# Patient Record
Sex: Male | Born: 1965 | Race: White | Hispanic: No | Marital: Married | Smoking: Never smoker
Health system: Southern US, Community
[De-identification: ages and names within clinical notes are randomized; demographics above are authoritative.]

## PROBLEM LIST (undated history)

## (undated) DIAGNOSIS — K219 Gastro-esophageal reflux disease without esophagitis: Secondary | ICD-10-CM

## (undated) HISTORY — PX: CHOLECYSTECTOMY: SHX55

## (undated) HISTORY — PX: SHOULDER SURGERY: SHX246

## (undated) HISTORY — PX: KNEE SURGERY: SHX244

## (undated) HISTORY — DX: Gastro-esophageal reflux disease without esophagitis: K21.9

---

## 2013-07-25 ENCOUNTER — Encounter: Payer: Self-pay | Admitting: *Deleted

## 2013-07-28 ENCOUNTER — Ambulatory Visit (INDEPENDENT_AMBULATORY_CARE_PROVIDER_SITE_OTHER): Payer: BC Managed Care – PPO | Admitting: Neurology

## 2013-07-28 ENCOUNTER — Encounter: Payer: Self-pay | Admitting: Neurology

## 2013-07-28 ENCOUNTER — Encounter (INDEPENDENT_AMBULATORY_CARE_PROVIDER_SITE_OTHER): Payer: Self-pay

## 2013-07-28 VITALS — BP 112/76 | HR 84 | Ht 71.5 in | Wt 194.0 lb

## 2013-07-28 DIAGNOSIS — R4020 Unspecified coma: Secondary | ICD-10-CM

## 2013-07-28 DIAGNOSIS — R404 Transient alteration of awareness: Secondary | ICD-10-CM

## 2013-07-28 NOTE — Patient Instructions (Signed)
Overall you are doing fairly well but I do want to suggest a few things today:   Remember to drink plenty of fluid, eat healthy meals and do not skip any meals. Try to eat protein with a every meal and eat a healthy snack such as fruit or nuts in between meals. Try to keep a regular sleep-wake schedule and try to exercise daily, particularly in the form of walking, 20-30 minutes a day, if you can.   As far as diagnostic testing:  1)Please schedule an EEG when you check out 2)I would like you to have a MRI of your brain, you will be called to schedule this  Please avoid driving while your workup is ongoing  I would like to see you back in 1 month or after you have followed up with cardiology, sooner if we need to.   Please also call us for any test results so we can go over those with you on the phone.  My clinical assistant and will answer any of your questions and relay your messages to me and also relay most of my messages to you.   Our phone number is [phone]. We also have an after hours call service for urgent matters and there is a physician on-call for urgent questions. For any emergencies you know to call 911 or go to the nearest emergency room

## 2013-07-28 NOTE — Progress Notes (Signed)
[facility]    Provider:  Dr Hosie PoissonSumner Referring Provider: Richardean Chimeraaniel, Terry G, MD Primary Care Physician:  Donzetta SprungANIEL, TERRY, MD  CC:  Syncope vs seizure  HPI:  Gary Ross is a 48 y.o. male here as a referral from Dr. Reuel Boomaniel for episode of loss of consciousness. Recalls waking up around 1am in the morning, had palpitations, chest pain, walked to end of bed and passed out. Per wife he was walking and fell over, "like a tree". She notes he was diaphoretic and gray. EMS arrived and blood pressure found to be low but was in normal sinus rhythm. Brought to ED, evaluated, had cardiac enzymes which was unremarkable. Discharged home with instructions to see cardiology. Few days later, had similar sensations of chest pain, palpitations. Was admitted to the hospital, had cardiac workup including stress test and echo, CT scan of heart. Evaluated by cardiology in the hospital who stated that they could not find anything wrong. Told by cardiology that he should avoid driving for a minimum of 3 months. Had discussed doing a Holter monitor with cardiology.   Wife notes when he was out, he was out for a prolonged period, around 15 minutes, notes he was very limp, no movement during these episode. Eyes were closed. Came too slowly, took around 30minutes to return to baseline. He did urinate during the episodes. No tongue biting. No history of head trauma. No recent fevers, illnesses, change in medications. Uses tobacco, no EtOH.   Overall healthy, notes having some difficulty getting to sleep. 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    Review of Systems: Out of a complete 14 system review, the patient complains of only the following symptoms, and all other reviewed systems are negative. + chest pain, palpitations, trouble swallowing, passing out, insomnia, snoring  History   Social History  . Marital Status: Married    Spouse Name: N/A    Number  of Children: N/A  . Years of Education: N/A   Occupational History  . Not on file.   Social History Main Topics  . Smoking status: Never Smoker   . Smokeless tobacco: Current User    Types: Snuff     Comment: 1 can a day  . Alcohol Use: Yes     Comment: occ  . Drug Use: No  . Sexual Activity: Yes   Other Topics Concern  . Not on file   Social History Narrative   Married, 1 child   Right handed   2 yr college   4-5 cups daily    No family history on file.  Past Medical History  Diagnosis Date  . GERD (gastroesophageal reflux disease)     Past Surgical History  Procedure Laterality Date  . Shoulder surgery Left   . Knee surgery Right   . Cholecystectomy      Current Outpatient Prescriptions  Medication Sig Dispense Refill  . EPINEPHrine (EPIPEN) 0.3 mg/0.3 mL IJ SOAJ injection Inject 0.3 mg into the muscle once.      . escitalopram (LEXAPRO) 10 MG tablet Take 10 mg by mouth daily.      Marland Kitchen. esomeprazole (NEXIUM) 20 MG capsule Take 20 mg by mouth daily at 12 noon.       No current facility-administered medications for this visit.    Allergies as of 07/28/2013 - Review Complete 07/28/2013  Allergen Reaction Noted  . Bee pollen  07/25/2013    Vitals: BP 112/76  Pulse  84  Ht 5' 11.5" (1.816 m)  Wt 194 lb (87.998 kg)  BMI 26.68 kg/m2 Last Weight:  Wt Readings from Last 1 Encounters:  07/28/13 194 lb (87.998 kg)   Last Height:   Ht Readings from Last 1 Encounters:  07/28/13 5' 11.5" (1.816 m)     Physical exam: Exam: Gen: NAD, conversant Eyes: anicteric sclerae, moist conjunctivae HENT: Atraumatic, oropharynx clear Neck: Trachea midline; supple,  Lungs: CTA, no wheezing, rales, rhonic                          CV: RRR, no MRG, no carotid bruits Abdomen: Soft, non-tender;  Extremities: No peripheral edema  Skin: Normal temperature, no rash,  Psych: Appropriate affect, pleasant  Neuro: MS: AA&Ox3, appropriately interactive, normal affect    Speech: fluent w/o paraphasic error  Memory: good recent and remote recall  CN: PERRL, EOMI no nystagmus, no ptosis, sensation intact to LT V1-V3 bilat, face symmetric, no weakness, hearing grossly intact, palate elevates symmetrically, shoulder shrug 5/5 bilat,  tongue protrudes midline, no fasiculations noted.  Motor: normal bulk and tone Strength: 5/5  In all extremities  Coord: rapid alternating and point-to-point (FNF, HTS) movements intact.  Reflexes: symmetrical, bilat downgoing toes  Sens: LT intact in all extremities  Gait: posture, stance, stride and arm-swing normal. Tandem gait intact. Able to walk on heels and toes. Romberg absent.   Assessment:  After physical and neurologic examination, review of laboratory studies, imaging, neurophysiology testing and pre-existing records, assessment will be reviewed on the problem list.  Plan:  Treatment plan and additional workup will be reviewed under Problem List.  1)Loss of consciousness  47y/o gentleman presenting for initial evaluation of episode of loss of consciousness concerning for seizure vs syncope. Events prior to episode of LOC concerning for a cardiac etiology but he has had an extensive cardiac workup which has been unremarkable. He has not had a Holter monitor performed. Pre LOC events concerning for cardiac syncope but prolonged nature of period of LOC with extended period of confusion raises question of possible seizure. Will check EEG, and brain MRI. Counseled patient to avoid driving while workup is ongoing. Will hold off on AED at this time due to unclear nature of events. Follow up once workup completed. Counseled patient to discuss possible Holter monitor with cardiology at next follow up.   Elspeth ChoPeter Yee Gangi, DO  [facility] [address] [place], [place] [id number]  Phone [phone] Fax [phone]

## 2013-07-30 ENCOUNTER — Telehealth: Payer: Self-pay | Admitting: Neurology

## 2013-07-30 ENCOUNTER — Ambulatory Visit (INDEPENDENT_AMBULATORY_CARE_PROVIDER_SITE_OTHER): Payer: BC Managed Care – PPO

## 2013-07-30 DIAGNOSIS — R404 Transient alteration of awareness: Secondary | ICD-10-CM

## 2013-07-30 DIAGNOSIS — R4020 Unspecified coma: Secondary | ICD-10-CM

## 2013-07-30 MED ORDER — GADOPENTETATE DIMEGLUMINE 469.01 MG/ML IV SOLN: 18.0000 mL | Freq: Once | INTRAVENOUS | Status: AC | PRN

## 2013-07-30 NOTE — Procedures (Signed)
    History:  Vallarie MareDarrel Hellen is a 48 year old gentleman with a history of syncope that occurred recently. The had an episode of chest pain, palpitations of the heart, and subsequent loss of consciousness. No jerking was noted with the episode of unconsciousness lasting 15 minutes. The patient is being evaluated for possible seizure-type events.  This is a routine EEG. No skull defects are noted. Medications include Lexapro, and Nexium.   EEG classification: Normal awake  Description of the recording: The background rhythms of this recording consists of a fairly well modulated medium amplitude alpha rhythm of 9 Hz that is reactive to eye opening and closure. As the record progresses, the patient appears to remain in the waking state throughout the recording. Photic stimulation was performed, resulting in a bilateral and symmetric photic driving response. Hyperventilation was also performed, resulting in a minimal buildup of the background rhythm activities without significant slowing seen. At no time during the recording does there appear to be evidence of spike or spike wave discharges or evidence of focal slowing. EKG monitor shows no evidence of cardiac rhythm abnormalities with a heart rate of 78.  Impression: This is a normal EEG recording in the waking state. No evidence of ictal or interictal discharges are seen.

## 2013-07-31 NOTE — Progress Notes (Signed)
Quick Note:  Patient has an appointment with the cardiologist Dr. Consuello ClossGanj on 07/09/13 at 1 pm, patient appointment was r/s to June 23 rd at 1:30 pm. ______

## 2013-07-31 NOTE — Progress Notes (Signed)
Quick Note:  Informed patient that his EEG was normal, patient wanted to know if you wanted to see him to go over results of both the EEG and MRI, per emr he could follow up with you or after he sees the cardiologist, patient states that he has to get referred to a cardiologist. Please advise. ______

## 2013-08-29 ENCOUNTER — Ambulatory Visit: Payer: BC Managed Care – PPO | Admitting: Neurology

## 2013-09-08 ENCOUNTER — Telehealth: Payer: Self-pay | Admitting: *Deleted

## 2013-09-08 NOTE — Telephone Encounter (Signed)
Spoke with patient's wife Efraim KaufmannMelissa, appointment has been r/s to 06/24 at 2:30 pm.

## 2013-09-09 ENCOUNTER — Ambulatory Visit: Payer: BC Managed Care – PPO | Admitting: Neurology

## 2013-09-10 ENCOUNTER — Ambulatory Visit (INDEPENDENT_AMBULATORY_CARE_PROVIDER_SITE_OTHER): Payer: BC Managed Care – PPO | Admitting: Neurology

## 2013-09-10 ENCOUNTER — Encounter: Payer: Self-pay | Admitting: Neurology

## 2013-09-10 VITALS — BP 120/73 | HR 88 | Ht 71.5 in | Wt 196.0 lb

## 2013-09-10 DIAGNOSIS — R404 Transient alteration of awareness: Secondary | ICD-10-CM

## 2013-09-10 DIAGNOSIS — R4020 Unspecified coma: Secondary | ICD-10-CM

## 2013-09-10 NOTE — Progress Notes (Signed)
[facility]    Provider:  Dr Hosie PoissonSumner Referring Provider: Richardean Chimeraaniel, Terry G, MD Primary Care Physician:  Donzetta SprungANIEL, TERRY, MD  CC:  Syncope vs seizure  HPI:  Gary Ross is a 48 y.o. male here as a follow upfrom Dr. Reuel Boomaniel for episode of loss of consciousness. At last visit he had a brain MRI and EEG, both of which were normal. He was also evaluated by cardiology (Dr Jacinto HalimGanji). Records not available but per the patient cardiology felt it was syncopal and cleared him to drive. Patient denies any further episodes, overall doing well since last visit.    Initial visit 07/2013: Recalls waking up around 1am in the morning, had palpitations, chest pain, walked to end of bed and passed out. Per wife he was walking and fell over, "like a tree". She notes he was diaphoretic and gray. EMS arrived and blood pressure found to be low but was in normal sinus rhythm. Brought to ED, evaluated, had cardiac enzymes which was unremarkable. Discharged home with instructions to see cardiology. Few days later, had similar sensations of chest pain, palpitations. Was admitted to the hospital, had cardiac workup including stress test and echo, CT scan of heart. Evaluated by cardiology in the hospital who stated that they could not find anything wrong. Told by cardiology that he should avoid driving for a minimum of 3 months. Had discussed doing a Holter monitor with cardiology.   Wife notes when he was out, he was out for a prolonged period, around 15 minutes, notes he was very limp, no movement during these episode. Eyes were closed. Came too slowly, took around 30minutes to return to baseline. He did urinate during the episodes. No tongue biting. No history of head trauma. No recent fevers, illnesses, change in medications. Uses tobacco, no EtOH.   Overall healthy, notes having some difficulty getting to sleep. 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     Review of Systems: Out of a complete 14 system review, the patient complains of only the following symptoms, and all other reviewed systems are negative. + chest pain, palpitations, trouble swallowing, passing out, insomnia, snoring  History   Social History  . Marital Status: Married    Spouse Name: [name]    Number of Children: 1  . Years of Education: 12+   Occupational History  . Disabilty     Social History Main Topics  . Smoking status: Never Smoker   . Smokeless tobacco: Current User    Types: Snuff     Comment: 1 can a day  . Alcohol Use: Yes     Comment: occ  . Drug Use: No  . Sexual Activity: Yes   Other Topics Concern  . Not on file   Social History Narrative   Lives at home with wife Efraim KaufmannMelissa , 1 child   Right handed   2 yr college   3-4 glasses of tea daily    Patient is currently on disability     History reviewed. No pertinent family history.  Past Medical History  Diagnosis Date  . GERD (gastroesophageal reflux disease)     Past Surgical History  Procedure Laterality Date  . Shoulder surgery Left   . Knee surgery Right   . Cholecystectomy      Current Outpatient Prescriptions  Medication Sig Dispense Refill  . EPINEPHrine (EPIPEN) 0.3 mg/0.3 mL IJ SOAJ injection Inject 0.3 mg into the muscle once.      .Marland Kitchen  escitalopram (LEXAPRO) 10 MG tablet Take 10 mg by mouth daily.      Marland Kitchen. esomeprazole (NEXIUM) 20 MG capsule Take 40 mg by mouth as needed.        No current facility-administered medications for this visit.    Allergies as of 09/10/2013 - Review Complete 09/10/2013  Allergen Reaction Noted  . Bee pollen  07/25/2013    Vitals: BP 120/73  Pulse 88  Ht 5' 11.5" (1.816 m)  Wt 196 lb (88.905 kg)  BMI 26.96 kg/m2 Last Weight:  Wt Readings from Last 1 Encounters:  09/10/13 196 lb (88.905 kg)   Last Height:   Ht Readings from Last 1 Encounters:  09/10/13 5' 11.5" (1.816 m)     Physical exam: Exam: Gen: NAD,  conversant Eyes: anicteric sclerae, moist conjunctivae HENT: Atraumatic, oropharynx clear Neck: Trachea midline; supple,  Lungs: CTA, no wheezing, rales, rhonic                          CV: RRR, no MRG, no carotid bruits Abdomen: Soft, non-tender;  Extremities: No peripheral edema  Skin: Normal temperature, no rash,  Psych: Appropriate affect, pleasant  Neuro: MS: AA&Ox3, appropriately interactive, normal affect   Speech: fluent w/o paraphasic error  Memory: good recent and remote recall  CN: PERRL, EOMI no nystagmus, no ptosis, sensation intact to LT V1-V3 bilat, face symmetric, no weakness, hearing grossly intact, palate elevates symmetrically, shoulder shrug 5/5 bilat,  tongue protrudes midline, no fasiculations noted.  Motor: normal bulk and tone Strength: 5/5  In all extremities  Coord: rapid alternating and point-to-point (FNF, HTS) movements intact.  Reflexes: symmetrical, bilat downgoing toes  Sens: LT intact in all extremities  Gait: posture, stance, stride and arm-swing normal. Tandem gait intact. Able to walk on heels and toes. Romberg absent.   Assessment:  After physical and neurologic examination, review of laboratory studies, imaging, neurophysiology testing and pre-existing records, assessment will be reviewed on the problem list.  Plan:  Treatment plan and additional workup will be reviewed under Problem List.  1)Loss of consciousness: likely syncopal episode  47y/o gentleman presenting for follow up evaluation of episode of loss of consciousness concerning for seizure vs syncope. Overall based on history suspect this represents a syncopal episode. If seizure episode he has a normal neurological exam, normal EEG and normal brain MRI, based on that he has a lower risk of having subsequent events. Awaiting notes from cardiology but no further neurological workup at this time. Will request patient remain out of work until 09/29/2013.    Elspeth ChoPeter Sumner,  DO  [facility] [address] [place], [place] [id number]  Phone [phone] Fax [phone]

## 2014-01-07 ENCOUNTER — Telehealth: Payer: Self-pay | Admitting: Neurology

## 2014-01-07 NOTE — Telephone Encounter (Signed)
Prior Dr. Hosie PoissonSumner patient. Talked to wife who stated patient is doing great - the syncopal episode was an isolated incident. They will call back if they need our services in the future. 01/07/2014 gb

## 2015-07-27 DIAGNOSIS — S83242D Other tear of medial meniscus, current injury, left knee, subsequent encounter: Secondary | ICD-10-CM | POA: Diagnosis not present

## 2015-07-27 DIAGNOSIS — S83282D Other tear of lateral meniscus, current injury, left knee, subsequent encounter: Secondary | ICD-10-CM | POA: Diagnosis not present

## 2015-09-07 DIAGNOSIS — S83282D Other tear of lateral meniscus, current injury, left knee, subsequent encounter: Secondary | ICD-10-CM | POA: Diagnosis not present

## 2015-09-07 DIAGNOSIS — S83242D Other tear of medial meniscus, current injury, left knee, subsequent encounter: Secondary | ICD-10-CM | POA: Diagnosis not present

## 2015-10-19 DIAGNOSIS — J301 Allergic rhinitis due to pollen: Secondary | ICD-10-CM | POA: Diagnosis not present

## 2015-10-19 DIAGNOSIS — M1712 Unilateral primary osteoarthritis, left knee: Secondary | ICD-10-CM | POA: Diagnosis not present

## 2015-10-19 DIAGNOSIS — F411 Generalized anxiety disorder: Secondary | ICD-10-CM | POA: Diagnosis not present

## 2015-10-19 DIAGNOSIS — K219 Gastro-esophageal reflux disease without esophagitis: Secondary | ICD-10-CM | POA: Diagnosis not present

## 2015-11-01 DIAGNOSIS — Z6828 Body mass index (BMI) 28.0-28.9, adult: Secondary | ICD-10-CM | POA: Diagnosis not present

## 2015-11-01 DIAGNOSIS — M25522 Pain in left elbow: Secondary | ICD-10-CM | POA: Diagnosis not present

## 2015-11-01 DIAGNOSIS — M25521 Pain in right elbow: Secondary | ICD-10-CM | POA: Diagnosis not present

## 2016-03-07 DIAGNOSIS — F5221 Male erectile disorder: Secondary | ICD-10-CM | POA: Diagnosis not present

## 2016-03-07 DIAGNOSIS — M1712 Unilateral primary osteoarthritis, left knee: Secondary | ICD-10-CM | POA: Diagnosis not present

## 2016-03-07 DIAGNOSIS — K219 Gastro-esophageal reflux disease without esophagitis: Secondary | ICD-10-CM | POA: Diagnosis not present

## 2016-03-07 DIAGNOSIS — Z6829 Body mass index (BMI) 29.0-29.9, adult: Secondary | ICD-10-CM | POA: Diagnosis not present

## 2016-03-07 DIAGNOSIS — F411 Generalized anxiety disorder: Secondary | ICD-10-CM | POA: Diagnosis not present

## 2016-04-12 DIAGNOSIS — Z1211 Encounter for screening for malignant neoplasm of colon: Secondary | ICD-10-CM | POA: Diagnosis not present

## 2016-04-21 DIAGNOSIS — Z79899 Other long term (current) drug therapy: Secondary | ICD-10-CM | POA: Diagnosis not present

## 2016-04-21 DIAGNOSIS — K219 Gastro-esophageal reflux disease without esophagitis: Secondary | ICD-10-CM | POA: Diagnosis not present

## 2016-04-21 DIAGNOSIS — Z1211 Encounter for screening for malignant neoplasm of colon: Secondary | ICD-10-CM | POA: Diagnosis not present

## 2016-04-21 DIAGNOSIS — F329 Major depressive disorder, single episode, unspecified: Secondary | ICD-10-CM | POA: Diagnosis not present

## 2016-04-21 DIAGNOSIS — Z9049 Acquired absence of other specified parts of digestive tract: Secondary | ICD-10-CM | POA: Diagnosis not present

## 2016-06-08 DIAGNOSIS — Z0000 Encounter for general adult medical examination without abnormal findings: Secondary | ICD-10-CM | POA: Diagnosis not present

## 2016-06-12 DIAGNOSIS — Z1212 Encounter for screening for malignant neoplasm of rectum: Secondary | ICD-10-CM | POA: Diagnosis not present

## 2016-06-12 DIAGNOSIS — F5221 Male erectile disorder: Secondary | ICD-10-CM | POA: Diagnosis not present

## 2016-06-12 DIAGNOSIS — K219 Gastro-esophageal reflux disease without esophagitis: Secondary | ICD-10-CM | POA: Diagnosis not present

## 2016-06-12 DIAGNOSIS — Z0001 Encounter for general adult medical examination with abnormal findings: Secondary | ICD-10-CM | POA: Diagnosis not present

## 2016-06-12 DIAGNOSIS — F411 Generalized anxiety disorder: Secondary | ICD-10-CM | POA: Diagnosis not present

## 2016-06-12 DIAGNOSIS — M1712 Unilateral primary osteoarthritis, left knee: Secondary | ICD-10-CM | POA: Diagnosis not present

## 2016-06-12 DIAGNOSIS — Z23 Encounter for immunization: Secondary | ICD-10-CM | POA: Diagnosis not present

## 2017-04-09 DIAGNOSIS — J4521 Mild intermittent asthma with (acute) exacerbation: Secondary | ICD-10-CM | POA: Diagnosis not present

## 2017-04-09 DIAGNOSIS — Z6830 Body mass index (BMI) 30.0-30.9, adult: Secondary | ICD-10-CM | POA: Diagnosis not present

## 2017-06-18 DIAGNOSIS — R42 Dizziness and giddiness: Secondary | ICD-10-CM | POA: Diagnosis not present

## 2017-06-18 DIAGNOSIS — R110 Nausea: Secondary | ICD-10-CM | POA: Diagnosis not present

## 2017-06-18 DIAGNOSIS — Z6829 Body mass index (BMI) 29.0-29.9, adult: Secondary | ICD-10-CM | POA: Diagnosis not present

## 2017-10-15 DIAGNOSIS — M545 Low back pain: Secondary | ICD-10-CM | POA: Diagnosis not present

## 2017-10-15 DIAGNOSIS — Z6828 Body mass index (BMI) 28.0-28.9, adult: Secondary | ICD-10-CM | POA: Diagnosis not present

## 2017-12-19 DIAGNOSIS — M1712 Unilateral primary osteoarthritis, left knee: Secondary | ICD-10-CM | POA: Diagnosis not present

## 2017-12-19 DIAGNOSIS — Z0001 Encounter for general adult medical examination with abnormal findings: Secondary | ICD-10-CM | POA: Diagnosis not present

## 2017-12-19 DIAGNOSIS — K219 Gastro-esophageal reflux disease without esophagitis: Secondary | ICD-10-CM | POA: Diagnosis not present

## 2017-12-19 DIAGNOSIS — F411 Generalized anxiety disorder: Secondary | ICD-10-CM | POA: Diagnosis not present

## 2017-12-19 DIAGNOSIS — E663 Overweight: Secondary | ICD-10-CM | POA: Diagnosis not present

## 2017-12-19 DIAGNOSIS — F5221 Male erectile disorder: Secondary | ICD-10-CM | POA: Diagnosis not present

## 2018-03-05 DIAGNOSIS — Z6829 Body mass index (BMI) 29.0-29.9, adult: Secondary | ICD-10-CM | POA: Diagnosis not present

## 2018-03-05 DIAGNOSIS — K219 Gastro-esophageal reflux disease without esophagitis: Secondary | ICD-10-CM | POA: Diagnosis not present

## 2018-03-08 DIAGNOSIS — J209 Acute bronchitis, unspecified: Secondary | ICD-10-CM | POA: Diagnosis not present

## 2018-03-08 DIAGNOSIS — Z6830 Body mass index (BMI) 30.0-30.9, adult: Secondary | ICD-10-CM | POA: Diagnosis not present

## 2018-03-08 DIAGNOSIS — J069 Acute upper respiratory infection, unspecified: Secondary | ICD-10-CM | POA: Diagnosis not present

## 2018-06-26 DIAGNOSIS — F5221 Male erectile disorder: Secondary | ICD-10-CM | POA: Diagnosis not present

## 2018-06-26 DIAGNOSIS — K219 Gastro-esophageal reflux disease without esophagitis: Secondary | ICD-10-CM | POA: Diagnosis not present

## 2018-06-26 DIAGNOSIS — F411 Generalized anxiety disorder: Secondary | ICD-10-CM | POA: Diagnosis not present

## 2018-06-26 DIAGNOSIS — M1712 Unilateral primary osteoarthritis, left knee: Secondary | ICD-10-CM | POA: Diagnosis not present

## 2018-11-14 DIAGNOSIS — R51 Headache: Secondary | ICD-10-CM | POA: Diagnosis not present

## 2018-11-14 DIAGNOSIS — K219 Gastro-esophageal reflux disease without esophagitis: Secondary | ICD-10-CM | POA: Diagnosis not present

## 2018-11-14 DIAGNOSIS — R05 Cough: Secondary | ICD-10-CM | POA: Diagnosis not present

## 2018-11-14 DIAGNOSIS — R358 Other polyuria: Secondary | ICD-10-CM | POA: Diagnosis not present

## 2019-02-14 DIAGNOSIS — U071 COVID-19: Secondary | ICD-10-CM | POA: Diagnosis not present

## 2019-02-14 DIAGNOSIS — J1289 Other viral pneumonia: Secondary | ICD-10-CM | POA: Diagnosis not present

## 2019-02-21 ENCOUNTER — Emergency Department (HOSPITAL_COMMUNITY)
Admission: EM | Admit: 2019-02-21 | Discharge: 2019-02-21 | Disposition: A | Discharge status: Home / Self Care | Payer: BC Managed Care – PPO | Attending: Emergency Medicine | Admitting: Emergency Medicine

## 2019-02-21 ENCOUNTER — Other Ambulatory Visit: Payer: Self-pay

## 2019-02-21 ENCOUNTER — Encounter (HOSPITAL_COMMUNITY): Payer: Self-pay | Admitting: Emergency Medicine

## 2019-02-21 ENCOUNTER — Emergency Department (HOSPITAL_COMMUNITY): Payer: BC Managed Care – PPO

## 2019-02-21 DIAGNOSIS — Z79899 Other long term (current) drug therapy: Secondary | ICD-10-CM | POA: Insufficient documentation

## 2019-02-21 DIAGNOSIS — J1289 Other viral pneumonia: Secondary | ICD-10-CM | POA: Diagnosis not present

## 2019-02-21 DIAGNOSIS — M791 Myalgia, unspecified site: Secondary | ICD-10-CM | POA: Diagnosis not present

## 2019-02-21 DIAGNOSIS — U071 COVID-19: Secondary | ICD-10-CM | POA: Diagnosis not present

## 2019-02-21 DIAGNOSIS — F17220 Nicotine dependence, chewing tobacco, uncomplicated: Secondary | ICD-10-CM | POA: Diagnosis not present

## 2019-02-21 DIAGNOSIS — R509 Fever, unspecified: Secondary | ICD-10-CM | POA: Diagnosis not present

## 2019-02-21 LAB — COMPREHENSIVE METABOLIC PANEL
ALT: 42 U/L (ref 0–44)
AST: 43 U/L — ABNORMAL HIGH (ref 15–41)
Albumin: 3.4 g/dL — ABNORMAL LOW (ref 3.5–5.0)
Alkaline Phosphatase: 72 U/L (ref 38–126)
Anion gap: 11 (ref 5–15)
BUN: 17 mg/dL (ref 6–20)
CO2: 28 mmol/L (ref 22–32)
Calcium: 7.8 mg/dL — ABNORMAL LOW (ref 8.9–10.3)
Chloride: 96 mmol/L — ABNORMAL LOW (ref 98–111)
Creatinine, Ser: 1.32 mg/dL — ABNORMAL HIGH (ref 0.61–1.24)
GFR calc Af Amer: >60 mL/min (ref >60)
GFR calc non Af Amer: >60 mL/min (ref >60)
Glucose, Bld: 104 mg/dL — ABNORMAL HIGH (ref 70–99)
Potassium: 3.3 mmol/L — ABNORMAL LOW (ref 3.5–5.1)
Sodium: 135 mmol/L (ref 135–145)
Total Bilirubin: 0.9 mg/dL (ref 0.3–1.2)
Total Protein: 7.1 g/dL (ref 6.5–8.1)

## 2019-02-21 LAB — CBC WITH DIFFERENTIAL/PLATELET
Abs Immature Granulocytes: 0.01 10*3/uL (ref 0.00–0.07)
Basophils Absolute: 0 10*3/uL (ref 0.0–0.1)
Basophils Relative: 0 %
Eosinophils Absolute: 0 10*3/uL (ref 0.0–0.5)
Eosinophils Relative: 0 %
HCT: 44.9 % (ref 39.0–52.0)
Hemoglobin: 15 g/dL (ref 13.0–17.0)
Immature Granulocytes: 0 %
Lymphocytes Relative: 16 %
Lymphs Abs: 0.8 10*3/uL (ref 0.7–4.0)
MCH: 29.3 pg (ref 26.0–34.0)
MCHC: 33.4 g/dL (ref 30.0–36.0)
MCV: 87.7 fL (ref 80.0–100.0)
Monocytes Absolute: 0.4 10*3/uL (ref 0.1–1.0)
Monocytes Relative: 8 %
Neutro Abs: 3.8 10*3/uL (ref 1.7–7.7)
Neutrophils Relative %: 76 %
Platelets: 215 10*3/uL (ref 150–400)
RBC: 5.12 MIL/uL (ref 4.22–5.81)
RDW: 12.5 % (ref 11.5–15.5)
WBC: 5 10*3/uL (ref 4.0–10.5)
nRBC: 0 % (ref 0.0–0.2)

## 2019-02-21 LAB — URINALYSIS, ROUTINE W REFLEX MICROSCOPIC
Bacteria, UA: NONE SEEN
Bilirubin Urine: NEGATIVE
Glucose, UA: NEGATIVE mg/dL
Hgb urine dipstick: NEGATIVE
Ketones, ur: 20 mg/dL — AB
Leukocytes,Ua: NEGATIVE
Nitrite: NEGATIVE
Protein, ur: 30 mg/dL — AB
Specific Gravity, Urine: 1.023 (ref 1.005–1.030)
pH: 6 (ref 5.0–8.0)

## 2019-02-21 MED ORDER — SODIUM CHLORIDE 0.9 % IV BOLUS
1000.0000 mL | Freq: Once | INTRAVENOUS | Status: AC
  Administered 2019-02-21: 15:00:00 1000 mL via INTRAVENOUS

## 2019-02-21 MED ORDER — PREDNISONE 50 MG PO TABS: ORAL_TABLET | ORAL | 0 refills | Status: AC

## 2019-02-21 MED ORDER — PREDNISONE 50 MG PO TABS
60.0000 mg | ORAL_TABLET | Freq: Once | ORAL | Status: AC
  Administered 2019-02-21: 60 mg via ORAL
  Filled 2019-02-21: qty 1

## 2019-02-21 MED ORDER — ONDANSETRON HCL 4 MG/2ML IJ SOLN
4.0000 mg | Freq: Once | INTRAMUSCULAR | Status: AC
  Administered 2019-02-21: 4 mg via INTRAVENOUS
  Filled 2019-02-21: qty 2

## 2019-02-21 MED ORDER — BENZONATATE 100 MG PO CAPS
200.0000 mg | ORAL_CAPSULE | Freq: Once | ORAL | Status: AC
  Administered 2019-02-21: 200 mg via ORAL
  Filled 2019-02-21: qty 2

## 2019-02-21 MED ORDER — ACETAMINOPHEN 500 MG PO TABS
1000.0000 mg | ORAL_TABLET | Freq: Once | ORAL | Status: AC
  Administered 2019-02-21: 1000 mg via ORAL
  Filled 2019-02-21: qty 2

## 2019-02-21 NOTE — ED Notes (Signed)
Patient ambulated well in the room. O2 sats were 91% sitting. O2 sats were in between 91 and 92% while ambulating.

## 2019-02-21 NOTE — ED Triage Notes (Signed)
Dx w/ covid x 1week ago.  Reports he does not feel any better.  Back ache, cough and body pain.  Denies any shortness of breath.

## 2019-02-21 NOTE — ED Notes (Signed)
Patient was informed that we need a urine sample.  

## 2019-02-21 NOTE — Discharge Instructions (Addendum)
Take your next dose of prednisone tomorrow evening.  This can help reduce your symptoms. I also recommend taking tylenol every 6 hours if needed for fever reduction - this will help you feel better as you are recovering from this illness.  Rest, increase fluid intake.  Get rechecked immediately if you develop any shortness of breath.  You will need to maintain home isolation until you have been fever free for 3 days.

## 2019-02-21 NOTE — ED Provider Notes (Signed)
[facility] EMERGENCY DEPARTMENT Provider Note   CSN: [id number] Arrival date & time: 02/21/19  1325     History   Chief Complaint Chief Complaint  Patient presents with  . Generalized Body Aches    HPI Gary Ross is a 53 y.o. male with a history of GERD only, presenting with multiple complaints associated with his Covid infection.  He is currently on day 11 of symptoms and tested positive last week for Covid by his PCP.  He does not feel any better, main complaints being persistent nonproductive cough, fever currently 102.9, nausea without emesis, myalgias and generalized fatigue.  He also had diarrhea which resolved 2 days ago.  He was taking Tylenol cold and flu medication, and was also prescribed Zithromax and prednisone by his PCP which he has completed.  He has been able to force Gatorade but otherwise has had a poor appetite and little solid intake for the past several days.     The history is provided by the patient.    Past Medical History:  Diagnosis Date  . GERD (gastroesophageal reflux disease)     There are no active problems to display for this patient.   Past Surgical History:  Procedure Laterality Date  . CHOLECYSTECTOMY    . KNEE SURGERY Right   . SHOULDER SURGERY Left         Home Medications    Prior to Admission medications   Medication Sig Start Date End Date Taking? Authorizing Provider  DULoxetine (CYMBALTA) 30 MG capsule Take 30 mg by mouth daily. 01/10/19  Yes [provider]  pantoprazole (PROTONIX) 40 MG tablet Take 40 mg by mouth every morning. 02/14/19  Yes [provider]  tamsulosin (FLOMAX) 0.4 MG CAPS capsule Take 0.4 mg by mouth at bedtime. 02/12/19  Yes [provider]  EPINEPHrine (EPIPEN) 0.3 mg/0.3 mL IJ SOAJ injection Inject 0.3 mg into the muscle once.    [provider]  predniSONE (DELTASONE) 50 MG tablet Take one tablet daily for 7 days 02/21/19   Burgess AmorIdol, Moya Duan, PA-C    Family History  No family history on file.  Social History Social History   Tobacco Use  . Smoking status: Never Smoker  . Smokeless tobacco: Current User    Types: Snuff  . Tobacco comment: 1 can a day  Substance Use Topics  . Alcohol use: Yes    Comment: occ  . Drug use: No     Allergies   Bee pollen   Review of Systems Review of Systems  Constitutional: Positive for chills, fatigue and fever.  HENT: Negative for congestion, rhinorrhea, sinus pressure, sore throat, trouble swallowing and voice change.   Eyes: Negative for discharge.  Respiratory: Positive for cough. Negative for shortness of breath, wheezing and stridor.   Cardiovascular: Negative for chest pain.  Gastrointestinal: Positive for nausea. Negative for abdominal pain and vomiting.  Genitourinary: Negative for decreased urine volume and dysuria.  Musculoskeletal: Positive for myalgias.     Physical Exam Updated Vital Signs BP 125/70   Pulse 92   Temp 99.1 F (37.3 C) (Oral)   Resp 12   Ht 5\' 11"  (1.803 m)   Wt 97.5 kg   SpO2 93%   BMI 29.99 kg/m   Physical Exam Vitals signs and nursing note reviewed.  Constitutional:      General: He is not in acute distress.    Appearance: He is well-developed. He is not toxic-appearing.  HENT:     Head: Normocephalic and  atraumatic.     Mouth/Throat:     Mouth: Mucous membranes are moist.     Pharynx: Oropharynx is clear.  Eyes:     Conjunctiva/sclera: Conjunctivae normal.  Neck:     Musculoskeletal: Normal range of motion.  Cardiovascular:     Rate and Rhythm: Regular rhythm. Tachycardia present.     Heart sounds: Normal heart sounds.  Pulmonary:     Effort: Pulmonary effort is normal. No respiratory distress.     Breath sounds: Normal breath sounds. No stridor. No wheezing or rhonchi.  Abdominal:     General: Bowel sounds are normal. There is no distension.     Palpations: Abdomen is soft.     Tenderness: There is no abdominal tenderness. There is no guarding.   Musculoskeletal: Normal range of motion.  Skin:    General: Skin is warm and dry.  Neurological:     Mental Status: He is alert.      ED Treatments / Results  Labs (all labs ordered are listed, but only abnormal results are displayed) Labs Reviewed  COMPREHENSIVE METABOLIC PANEL - Abnormal; Notable for the following components:      Result Value   Potassium 3.3 (*)    Chloride 96 (*)    Glucose, Bld 104 (*)    Creatinine, Ser 1.32 (*)    Calcium 7.8 (*)    Albumin 3.4 (*)    AST 43 (*)    All other components within normal limits  URINALYSIS, ROUTINE W REFLEX MICROSCOPIC - Abnormal; Notable for the following components:   APPearance HAZY (*)    Ketones, ur 20 (*)    Protein, ur 30 (*)    All other components within normal limits  CBC WITH DIFFERENTIAL/PLATELET    EKG None  Radiology Dg Chest Portable 1 View  Result Date: 02/21/2019 CLINICAL DATA:  COVID positive with fever. EXAM: PORTABLE CHEST 1 VIEW COMPARISON:  07/13/2013 FINDINGS: Heart size is normal. Patchy bilateral ill-defined nodular opacities in the chest favoring the lower lobes. No signs of consolidation or evidence of pleural effusion. No acute bone finding. IMPRESSION: Patchy bilateral ill-defined nodular opacities in the chest favoring the lower lobes. Findings of multifocal pneumonia, pattern could be seen in the setting of COVID-19 infection. Electronically Signed   By: Zetta Bills M.D.   On: 02/21/2019 14:52    Procedures Procedures (including critical care time)  Medications Ordered in ED Medications  acetaminophen (TYLENOL) tablet 1,000 mg (1,000 mg Oral Given 02/21/19 1443)  sodium chloride 0.9 % bolus 1,000 mL (0 mLs Intravenous Stopped 02/21/19 1613)  ondansetron (ZOFRAN) injection 4 mg (4 mg Intravenous Given 02/21/19 1441)  benzonatate (TESSALON) capsule 200 mg (200 mg Oral Given 02/21/19 1442)  predniSONE (DELTASONE) tablet 60 mg (60 mg Oral Given 02/21/19 1723)     Initial Impression /  Assessment and Plan / ED Course  I have reviewed the triage vital signs and the nursing notes.  Pertinent labs & imaging results that were available during my care of the patient were reviewed by me and considered in my medical decision making (see chart for details).        Pt with given IV fluids and tylenol while awaiting lab and imaging results.  Mild dehydration without hypotension or persistent tachycardia.  He felt much improved after receiving fluids.  cxr significant for Covid pneumonia.  He denies sob.  He was ambulated in his room, desat to 91%, but again, no complaint of sob.  He was started  on an additional prednisone 50 mg qd x 7 days. Encouraged rest, increased fluid intake.  Tylenol for fever reduction.  Discussed admission vs home care with close f/u, pt does not want to be admitted.  Return precautions outlined.  Advised he will need to maintain home isolation until he has been fever free for 3 days, currently on day 11 of his Covid course. He has already completed a course of zithromax, further abx not indicated for this viral pneumonia.  Pt was seen by Dr. Pilar Plate prior to dc home.  Gary Ross was evaluated in Emergency Department on 02/21/2019 for the symptoms described in the history of present illness. He was evaluated in the context of the global COVID-19 pandemic, which necessitated consideration that the patient might be at risk for infection with the SARS-CoV-2 virus that causes COVID-19. Institutional protocols and algorithms that pertain to the evaluation of patients at risk for COVID-19 are in a state of rapid change based on information released by regulatory bodies including the CDC and federal and state organizations. These policies and algorithms were followed during the patient's care in the ED.   Final Clinical Impressions(s) / ED Diagnoses   Final diagnoses:  Pneumonia due to COVID-19 virus    ED Discharge Orders         Ordered    predniSONE (DELTASONE)  50 MG tablet     02/21/19 1659           Burgess Amor, PA-C 02/21/19 1749    Milagros Loll, MD 02/21/19 [id number]

## 2019-02-22 ENCOUNTER — Telehealth (HOSPITAL_COMMUNITY): Payer: Self-pay | Admitting: Emergency Medicine

## 2019-02-22 MED ORDER — ONDANSETRON HCL 4 MG PO TABS: 4.0000 mg | ORAL_TABLET | Freq: Four times a day (QID) | ORAL | 0 refills | Status: AC

## 2019-02-22 MED ORDER — BENZONATATE 100 MG PO CAPS: 200.0000 mg | ORAL_CAPSULE | Freq: Three times a day (TID) | ORAL | 0 refills | Status: AC | PRN

## 2019-02-22 NOTE — Telephone Encounter (Signed)
Call to pt to assess sx and to assess need for nausea meds at home.  Pt would like this called in, also cough medicine.  Prescriptions for tessalon and zofran e scribed to his pharmacy.

## 2019-02-24 DIAGNOSIS — J1289 Other viral pneumonia: Secondary | ICD-10-CM | POA: Diagnosis not present

## 2019-02-24 DIAGNOSIS — K219 Gastro-esophageal reflux disease without esophagitis: Secondary | ICD-10-CM | POA: Diagnosis not present

## 2019-02-24 DIAGNOSIS — E871 Hypo-osmolality and hyponatremia: Secondary | ICD-10-CM | POA: Diagnosis not present

## 2019-02-24 DIAGNOSIS — Z720 Tobacco use: Secondary | ICD-10-CM | POA: Diagnosis not present

## 2019-02-24 DIAGNOSIS — F329 Major depressive disorder, single episode, unspecified: Secondary | ICD-10-CM | POA: Diagnosis not present

## 2019-02-24 DIAGNOSIS — J9601 Acute respiratory failure with hypoxia: Secondary | ICD-10-CM | POA: Diagnosis not present

## 2019-02-24 DIAGNOSIS — R0602 Shortness of breath: Secondary | ICD-10-CM | POA: Diagnosis not present

## 2019-02-24 DIAGNOSIS — R0902 Hypoxemia: Secondary | ICD-10-CM | POA: Diagnosis not present

## 2019-02-24 DIAGNOSIS — G43909 Migraine, unspecified, not intractable, without status migrainosus: Secondary | ICD-10-CM | POA: Diagnosis not present

## 2019-02-24 DIAGNOSIS — F17220 Nicotine dependence, chewing tobacco, uncomplicated: Secondary | ICD-10-CM | POA: Diagnosis not present

## 2019-02-24 DIAGNOSIS — F411 Generalized anxiety disorder: Secondary | ICD-10-CM | POA: Diagnosis not present

## 2019-02-24 DIAGNOSIS — E876 Hypokalemia: Secondary | ICD-10-CM | POA: Diagnosis not present

## 2019-02-24 DIAGNOSIS — N400 Enlarged prostate without lower urinary tract symptoms: Secondary | ICD-10-CM | POA: Diagnosis not present

## 2019-02-24 DIAGNOSIS — U071 COVID-19: Secondary | ICD-10-CM | POA: Diagnosis not present

## 2019-03-11 DIAGNOSIS — J1289 Other viral pneumonia: Secondary | ICD-10-CM | POA: Diagnosis not present

## 2019-03-11 DIAGNOSIS — U071 COVID-19: Secondary | ICD-10-CM | POA: Diagnosis not present

## 2019-10-14 DIAGNOSIS — T63441A Toxic effect of venom of bees, accidental (unintentional), initial encounter: Secondary | ICD-10-CM | POA: Diagnosis not present

## 2019-10-14 DIAGNOSIS — X58XXXA Exposure to other specified factors, initial encounter: Secondary | ICD-10-CM | POA: Diagnosis not present

## 2019-10-14 DIAGNOSIS — R609 Edema, unspecified: Secondary | ICD-10-CM | POA: Diagnosis not present

## 2019-10-14 DIAGNOSIS — R0902 Hypoxemia: Secondary | ICD-10-CM | POA: Diagnosis not present

## 2019-10-14 DIAGNOSIS — R531 Weakness: Secondary | ICD-10-CM | POA: Diagnosis not present

## 2019-10-14 DIAGNOSIS — I959 Hypotension, unspecified: Secondary | ICD-10-CM | POA: Diagnosis not present

## 2019-10-14 DIAGNOSIS — W57XXXA Bitten or stung by nonvenomous insect and other nonvenomous arthropods, initial encounter: Secondary | ICD-10-CM | POA: Diagnosis not present

## 2019-10-14 DIAGNOSIS — S0081XA Abrasion of other part of head, initial encounter: Secondary | ICD-10-CM | POA: Diagnosis not present

## 2019-10-15 DIAGNOSIS — M25562 Pain in left knee: Secondary | ICD-10-CM | POA: Diagnosis not present

## 2019-10-15 DIAGNOSIS — R55 Syncope and collapse: Secondary | ICD-10-CM | POA: Diagnosis not present

## 2019-10-15 DIAGNOSIS — Z6827 Body mass index (BMI) 27.0-27.9, adult: Secondary | ICD-10-CM | POA: Diagnosis not present

## 2019-10-28 DIAGNOSIS — M1712 Unilateral primary osteoarthritis, left knee: Secondary | ICD-10-CM | POA: Diagnosis not present

## 2019-10-28 DIAGNOSIS — S83282D Other tear of lateral meniscus, current injury, left knee, subsequent encounter: Secondary | ICD-10-CM | POA: Diagnosis not present

## 2019-11-25 DIAGNOSIS — S83282D Other tear of lateral meniscus, current injury, left knee, subsequent encounter: Secondary | ICD-10-CM | POA: Diagnosis not present

## 2019-11-25 DIAGNOSIS — M1712 Unilateral primary osteoarthritis, left knee: Secondary | ICD-10-CM | POA: Diagnosis not present

## 2019-11-28 ENCOUNTER — Encounter: Payer: Self-pay | Admitting: Allergy & Immunology

## 2019-11-28 ENCOUNTER — Other Ambulatory Visit: Payer: Self-pay

## 2019-11-28 ENCOUNTER — Ambulatory Visit (INDEPENDENT_AMBULATORY_CARE_PROVIDER_SITE_OTHER): Payer: BC Managed Care – PPO | Admitting: Allergy & Immunology

## 2019-11-28 VITALS — BP 120/70 | HR 97 | Temp 98.1°F | Resp 16 | Ht 71.0 in | Wt 207.4 lb

## 2019-11-28 DIAGNOSIS — Z7189 Other specified counseling: Secondary | ICD-10-CM | POA: Diagnosis not present

## 2019-11-28 DIAGNOSIS — T63481D Toxic effect of venom of other arthropod, accidental (unintentional), subsequent encounter: Secondary | ICD-10-CM | POA: Insufficient documentation

## 2019-11-28 DIAGNOSIS — Z7185 Encounter for immunization safety counseling: Secondary | ICD-10-CM

## 2019-11-28 NOTE — Patient Instructions (Addendum)
1. Insect sting allergy - We are going to stinging insect panel via the blood. - We are also going to get some labs to look for mast cell disease. - Strongly consider venom immunotherapy in the future.  - EpiPen is up to date. - Anaphylaxis management plan provided.   2. COVID-19 vaccine concerns - I would get the Moderna vaccine sine this is having better coverage against the Delta variant. - But either of the mRNA vaccines have strong protection against death and hospitalization, so even if you can only the Pfizer, I would get that for sure.   3. Return in about 1 year (around 11/27/2020).      Please inform us of any Emergency Department visits, hospitalizations, or changes in symptoms. Call us before going to the ED for breathing or allergy symptoms since we might be able to fit you in for a sick visit. Feel free to contact us anytime with any questions, problems, or concerns.  It was a pleasure to meet you today!  Websites that have reliable patient information: 1. American Academy of Asthma, Allergy, and Immunology: www.aaaai.org 2. Food Allergy Research and Education (FARE): foodallergy.org 3. Mothers of Asthmatics: http://www.asthmacommunitynetwork.org 4. American College of Allergy, Asthma, and Immunology: www.acaai.org   COVID-19 Vaccine Information can be found at: [email or web address].nl For questions related to vaccine distribution or appointments, please email [email or web address] or call [phone].     "Like" [place] on Facebook and Instagram for our latest updates!        Make sure you are registered to vote! If you have moved or changed any of your contact information, you will need to get this updated before voting!  In some cases, you MAY be able to register to vote online: [email or web address]

## 2019-11-28 NOTE — Progress Notes (Signed)
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 Insect sting allergy - We are going to stinging insect panel via the blood. - We are also going to get some labs to look for mast cell disease. - Strongly consider venom immunotherapy in the future.  - EpiPen is up to date. - Anaphylaxis management plan provided.   2. COVID-19 vaccine concerns - I would get the Moderna vaccine sine this is having better coverage against the Delta variant. - But either of the mRNA vaccines have strong protection against death and hospitalization, so even if you can only the Pfizer, I would get that for sure.  - You do not have any medical contraindications to receiving the vaccine.  3. Return in about 1 year (around 11/27/2020).    Subjective:   Gary Ross is a 54 y.o. male presenting today for evaluation of  Chief Complaint  Patient presents with  . Insect Bite    stung by yellow jacket had an anaphylactic reaction per pt    Gary Ross has a history of the following: Patient Active Problem List   Diagnosis Date Noted  . Vaccine counseling 11/28/2019  . Insect sting allergy, current reaction, accidental or unintentional, subsequent encounter 11/28/2019    History obtained from: chart review and patient.  Gary Ross was referred by Richardean Chimera, MD.     Gary Ross is a 54 y.o. male presenting for an evaluation of a stinging insect allergy.  He was mowing and ran over a yellow jacket nest. He was stung 2-3 times. He knew he was allergic to them. He went to the house and told his wife. He was feeling fine at the time. His wife gave him Benadryl 50mg . He then went to take a shower and the last thing that he remembers is that he started to soap up. Then he awoke at the bottom of the shower curled up. His wife  went to get him and was on all fours. He stood up to get the soap off and he passed out and hit his face on the seat in the shower. Then his wife called for his son to help out. They helped him get up and he passed out again. He did have an EpiPen but it was cloudy and out of date. EMTs came and got him.   He went to the emergency room. According to the note, his systolic was 84 in the field. His blood pressure recovered by the time that he got to the ED.  Ten years ago, he was stung about 17 times by yellow jackets. He went to the house and was taken to the ED. The stings were on the back of the neck and he was having problems breathing. He got some shots and was monitored and sent home.   When he was a child, he would have intense swelling when he was stung. He has never been tested for stinging insect allergies. He otherwise has no atopic complaints. He did have COVID19 in November 2020. He was given prednisone for five days to help things out, but he was worse off at that point. He went to the hospital and was given remdesivir and was on 10 LPM via Gary. He was hospitalized for 8 days with COVID pneumonia. He is not excited about the vaccination.  He has had several bowel preps and colonoscopies without a problem.   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  Otherwise, there is no history of other atopic diseases, including asthma, food allergies, drug allergies, environmental allergies, eczema, urticaria or contact dermatitis. There is no significant infectious history. Vaccinations are up to date.    Past Medical History: Patient Active Problem List   Diagnosis Date Noted  . Vaccine counseling 11/28/2019  . Insect sting allergy, current reaction, accidental or unintentional, subsequent encounter 11/28/2019    Medication List:  Allergies as of 11/28/2019      Reactions   Bee Pollen       Medication List       Accurate as of November 28, 2019   1:24 PM. If you have any questions, ask your nurse or doctor.        benzonatate 100 MG capsule Commonly known as: TESSALON Take 2 capsules (200 mg total) by mouth 3 (three) times daily as needed.   DULoxetine 30 MG capsule Commonly known as: CYMBALTA Take 30 mg by mouth daily.   EpiPen 0.3 mg/0.3 mL Soaj injection Generic drug: EPINEPHrine Inject 0.3 mg into the muscle once.   ondansetron 4 MG tablet Commonly known as: ZOFRAN Take 1 tablet (4 mg total) by mouth every 6 (six) hours.   pantoprazole 40 MG tablet Commonly known as: PROTONIX Take 40 mg by mouth every morning.   predniSONE 50 MG tablet Commonly known as: DELTASONE Take one tablet daily for 7 days   tamsulosin 0.4 MG Caps capsule Commonly known as: FLOMAX Take 0.4 mg by mouth at bedtime.       Birth History: non-contributory  Developmental History: non-contributory  Past Surgical History: Past Surgical History:  Procedure Laterality Date  . CHOLECYSTECTOMY    . KNEE SURGERY Right   . SHOULDER SURGERY Left      Family History: History reviewed. No pertinent family history.   Social History: Gary Ross lives at home with his family.  They live in a house that is 55 years old.  There is hardwood in the main living areas and carpeting in the bedroom.  They have a heat pump for heating and central cooling.  There are 2 pools inside of the home and a husky outside of the home.  There are dust mite covers on the bed, but not the pillows.  There is no tobacco exposure aside from chewing tobacco.  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  He is not exposed to fumes, chemicals, or dust.  He does not use a HEPA filter.  They do not live near an interstate or industrial area.     Review of Systems  Constitutional: Negative.  Negative for fever, malaise/fatigue and weight loss.  HENT: Negative.  Negative for congestion, ear discharge and ear pain.   Eyes: Negative for pain, discharge and  redness.  Respiratory: Negative for cough, sputum production, shortness of breath and wheezing.   Cardiovascular: Negative.  Negative for chest pain and palpitations.  Gastrointestinal: Negative for abdominal pain, heartburn, nausea and vomiting.  Skin: Negative.  Negative for itching and rash.  Neurological: Negative for dizziness and headaches.  Endo/Heme/Allergies: Negative for environmental allergies. Does not bruise/bleed easily.       Objective:   Blood pressure 120/70, pulse 97, temperature 98.1 F (36.7 C), temperature source Temporal, resp. rate 16, height 5\' 11"  (1.803 m), weight 207  lb 6.4 oz (94.1 kg), SpO2 97 %. Body mass index is 28.93 kg/m.   Physical Exam:   Physical Exam Constitutional:      Appearance: He is well-developed.     Comments: Talkative male.  HENT:     Head: Normocephalic and atraumatic.     Right Ear: Tympanic membrane, ear canal and external ear normal. No drainage, swelling or tenderness. Tympanic membrane is not injected, scarred, erythematous, retracted or bulging.     Left Ear: Tympanic membrane, ear canal and external ear normal. No drainage, swelling or tenderness. Tympanic membrane is not injected, scarred, erythematous, retracted or bulging.     Nose: No nasal deformity, septal deviation, mucosal edema or rhinorrhea.     Right Turbinates: Enlarged and swollen.     Left Turbinates: Enlarged and swollen.     Right Sinus: No maxillary sinus tenderness or frontal sinus tenderness.     Left Sinus: No maxillary sinus tenderness or frontal sinus tenderness.     Mouth/Throat:     Mouth: Mucous membranes are not pale and not dry.     Pharynx: Uvula midline.  Eyes:     General:        Right eye: No discharge.        Left eye: No discharge.     Conjunctiva/sclera: Conjunctivae normal.     Right eye: Right conjunctiva is not injected. No chemosis.    Left eye: Left conjunctiva is not injected. No chemosis.    Pupils: Pupils are equal, round,  and reactive to light.  Cardiovascular:     Rate and Rhythm: Normal rate and regular rhythm.     Heart sounds: Normal heart sounds.  Pulmonary:     Effort: Pulmonary effort is normal. No tachypnea, accessory muscle usage or respiratory distress.     Breath sounds: Normal breath sounds. No wheezing, rhonchi or rales.     Comments: Moving air well in all lung fields. Chest:     Chest wall: No tenderness.  Abdominal:     Tenderness: There is no abdominal tenderness. There is no guarding or rebound.  Lymphadenopathy:     Head:     Right side of head: No submandibular, tonsillar or occipital adenopathy.     Left side of head: No submandibular, tonsillar or occipital adenopathy.     Cervical: No cervical adenopathy.  Skin:    General: Skin is warm.     Capillary Refill: Capillary refill takes less than 2 seconds.     Coloration: Skin is not pale.     Findings: No abrasion, erythema, petechiae or rash. Rash is not papular, urticarial or vesicular.     Comments: No eczematous or urticarial lesions.  Neurological:     Mental Status: He is alert.  Psychiatric:        Behavior: Behavior is cooperative.      Diagnostic studies: labs sent instead      Gary Bonds, MD [facility]

## 2020-01-09 DIAGNOSIS — F5221 Male erectile disorder: Secondary | ICD-10-CM | POA: Diagnosis not present

## 2020-01-09 DIAGNOSIS — K219 Gastro-esophageal reflux disease without esophagitis: Secondary | ICD-10-CM | POA: Diagnosis not present

## 2020-01-09 DIAGNOSIS — Z114 Encounter for screening for human immunodeficiency virus [HIV]: Secondary | ICD-10-CM | POA: Diagnosis not present

## 2020-01-09 DIAGNOSIS — Z1159 Encounter for screening for other viral diseases: Secondary | ICD-10-CM | POA: Diagnosis not present

## 2020-01-09 DIAGNOSIS — Z13220 Encounter for screening for lipoid disorders: Secondary | ICD-10-CM | POA: Diagnosis not present

## 2020-01-09 DIAGNOSIS — Z125 Encounter for screening for malignant neoplasm of prostate: Secondary | ICD-10-CM | POA: Diagnosis not present

## 2020-01-14 DIAGNOSIS — F5221 Male erectile disorder: Secondary | ICD-10-CM | POA: Diagnosis not present

## 2020-01-14 DIAGNOSIS — F411 Generalized anxiety disorder: Secondary | ICD-10-CM | POA: Diagnosis not present

## 2020-01-14 DIAGNOSIS — M1712 Unilateral primary osteoarthritis, left knee: Secondary | ICD-10-CM | POA: Diagnosis not present

## 2020-01-14 DIAGNOSIS — F331 Major depressive disorder, recurrent, moderate: Secondary | ICD-10-CM | POA: Diagnosis not present

## 2020-01-14 DIAGNOSIS — Z23 Encounter for immunization: Secondary | ICD-10-CM | POA: Diagnosis not present

## 2020-01-14 DIAGNOSIS — Z0001 Encounter for general adult medical examination with abnormal findings: Secondary | ICD-10-CM | POA: Diagnosis not present

## 2020-01-28 DIAGNOSIS — Z23 Encounter for immunization: Secondary | ICD-10-CM | POA: Diagnosis not present

## 2020-08-13 IMAGING — DX DG CHEST 1V PORT
1 series · 1 of 1 positions shown · non-contrast
Comparison: 07/13/2013

CLINICAL DATA: COVID positive with fever.

EXAM:
PORTABLE CHEST 1 VIEW

[chest ap]
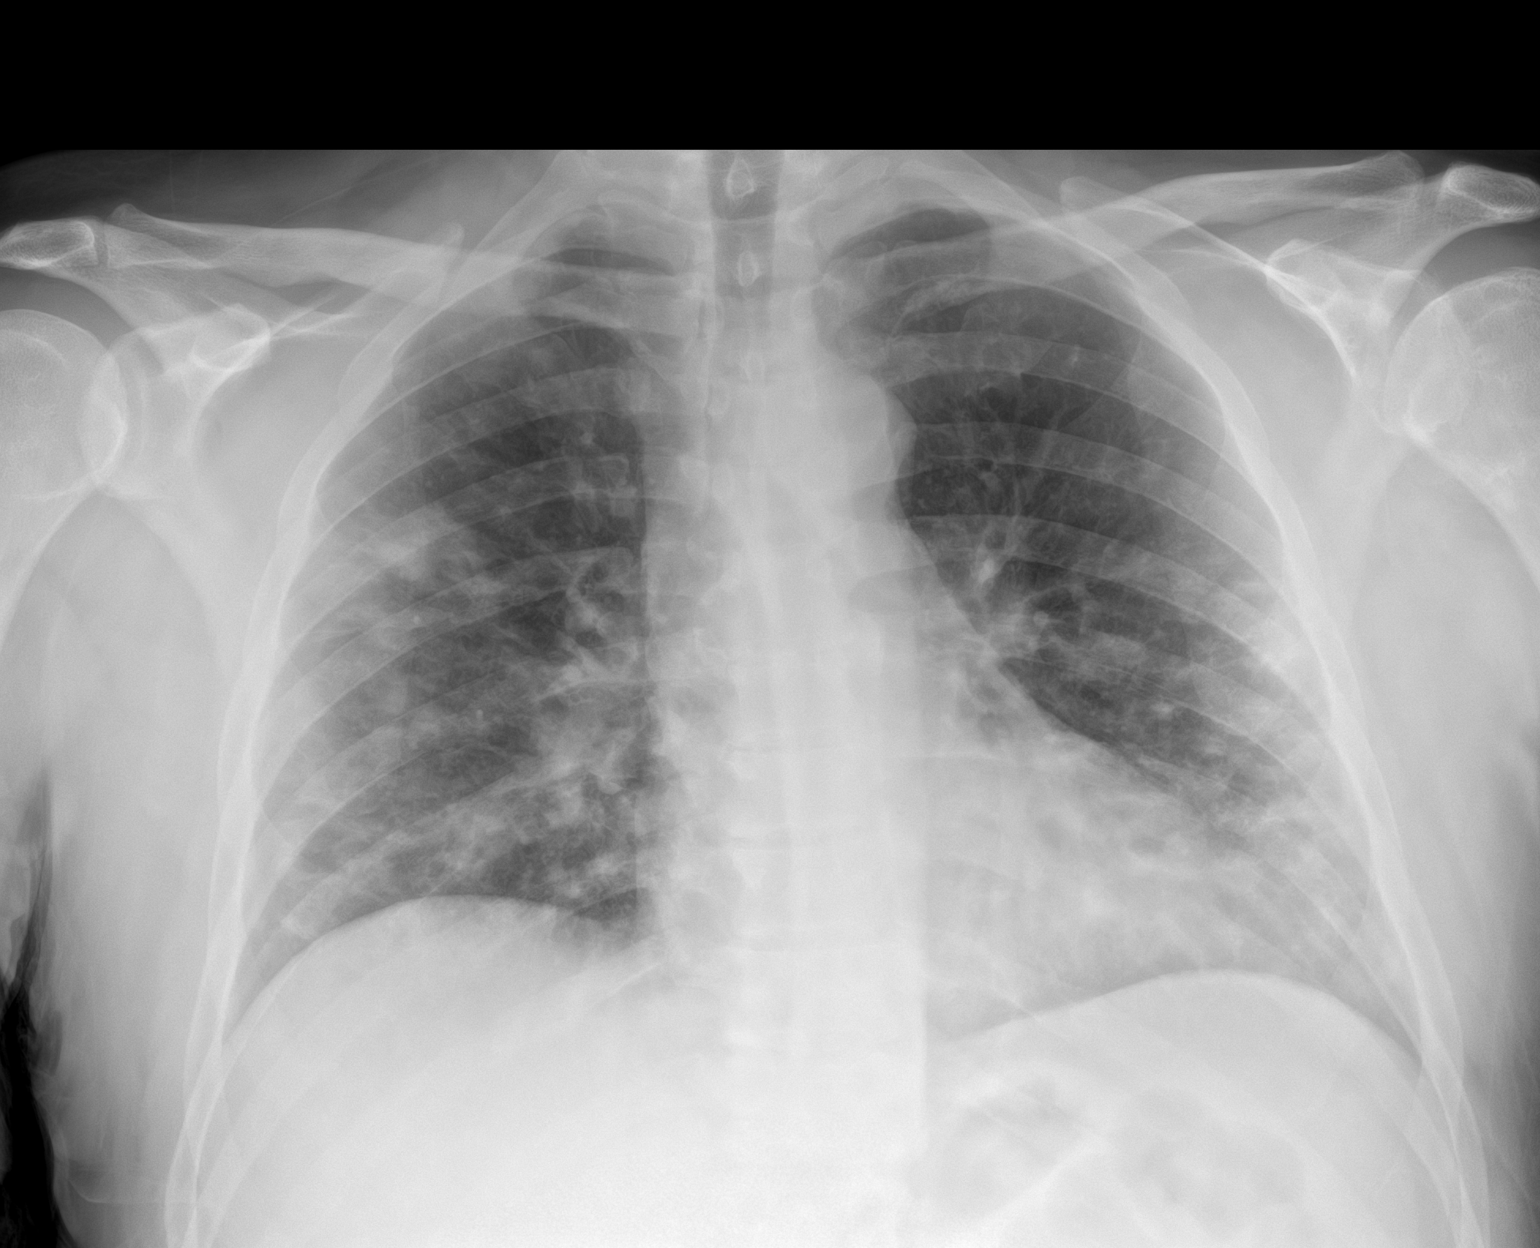

[1 of 1 positions shown; findings below may reference images not displayed]

FINDINGS: Heart size is normal. Patchy bilateral ill-defined nodular opacities
in the chest favoring the lower lobes.

No signs of consolidation or evidence of pleural effusion.

No acute bone finding.
IMPRESSION: Patchy bilateral ill-defined nodular opacities in the chest favoring
the lower lobes. Findings of multifocal pneumonia, pattern could be
seen in the setting of [id number] infection.
